# Patient Record
Sex: Female | Born: 1935 | Race: White | Hispanic: No | Smoking: Never smoker
Health system: Southern US, Community
[De-identification: ages and names within clinical notes are randomized; demographics above are authoritative.]

## PROBLEM LIST (undated history)

## (undated) DIAGNOSIS — F329 Major depressive disorder, single episode, unspecified: Secondary | ICD-10-CM

## (undated) DIAGNOSIS — R32 Unspecified urinary incontinence: Secondary | ICD-10-CM

## (undated) DIAGNOSIS — E079 Disorder of thyroid, unspecified: Secondary | ICD-10-CM

## (undated) DIAGNOSIS — Z9289 Personal history of other medical treatment: Secondary | ICD-10-CM

## (undated) DIAGNOSIS — I10 Essential (primary) hypertension: Secondary | ICD-10-CM

## (undated) DIAGNOSIS — E785 Hyperlipidemia, unspecified: Secondary | ICD-10-CM

## (undated) DIAGNOSIS — F419 Anxiety disorder, unspecified: Secondary | ICD-10-CM

## (undated) DIAGNOSIS — K219 Gastro-esophageal reflux disease without esophagitis: Secondary | ICD-10-CM

## (undated) DIAGNOSIS — M1990 Unspecified osteoarthritis, unspecified site: Secondary | ICD-10-CM

## (undated) DIAGNOSIS — D649 Anemia, unspecified: Secondary | ICD-10-CM

## (undated) DIAGNOSIS — H8109 Meniere's disease, unspecified ear: Secondary | ICD-10-CM

## (undated) DIAGNOSIS — F32A Depression, unspecified: Secondary | ICD-10-CM

## (undated) DIAGNOSIS — H9191 Unspecified hearing loss, right ear: Secondary | ICD-10-CM

## (undated) DIAGNOSIS — I38 Endocarditis, valve unspecified: Secondary | ICD-10-CM

## (undated) DIAGNOSIS — G43909 Migraine, unspecified, not intractable, without status migrainosus: Secondary | ICD-10-CM

## (undated) DIAGNOSIS — I219 Acute myocardial infarction, unspecified: Secondary | ICD-10-CM

## (undated) HISTORY — PX: JOINT REPLACEMENT: SHX530

## (undated) HISTORY — DX: Unspecified hearing loss, right ear: H91.91

## (undated) HISTORY — PX: ABDOMINAL HYSTERECTOMY: SHX81

## (undated) HISTORY — DX: Anxiety disorder, unspecified: F41.9

## (undated) HISTORY — PX: CAROTID ENDARTERECTOMY: SUR193

## (undated) HISTORY — DX: Major depressive disorder, single episode, unspecified: F32.9

## (undated) HISTORY — DX: Endocarditis, valve unspecified: I38

## (undated) HISTORY — DX: Hyperlipidemia, unspecified: E78.5

## (undated) HISTORY — DX: Unspecified urinary incontinence: R32

## (undated) HISTORY — DX: Depression, unspecified: F32.A

## (undated) HISTORY — DX: Meniere's disease, unspecified ear: H81.09

## (undated) HISTORY — DX: Unspecified osteoarthritis, unspecified site: M19.90

## (undated) HISTORY — DX: Disorder of thyroid, unspecified: E07.9

## (undated) HISTORY — PX: CORONARY ANGIOPLASTY WITH STENT PLACEMENT: SHX49

## (undated) HISTORY — DX: Gastro-esophageal reflux disease without esophagitis: K21.9

## (undated) HISTORY — DX: Essential (primary) hypertension: I10

## (undated) HISTORY — DX: Anemia, unspecified: D64.9

## (undated) HISTORY — DX: Migraine, unspecified, not intractable, without status migrainosus: G43.909

## (undated) HISTORY — PX: FOOT SURGERY: SHX648

## (undated) HISTORY — DX: Acute myocardial infarction, unspecified: I21.9

## (undated) HISTORY — DX: Personal history of other medical treatment: Z92.89

## (undated) HISTORY — PX: CORONARY ARTERY BYPASS GRAFT: SHX141

---

## 2004-01-24 ENCOUNTER — Other Ambulatory Visit: Payer: Self-pay

## 2005-09-27 ENCOUNTER — Other Ambulatory Visit: Payer: Self-pay

## 2006-02-27 ENCOUNTER — Other Ambulatory Visit: Payer: Self-pay

## 2008-10-28 ENCOUNTER — Ambulatory Visit: Payer: Self-pay | Admitting: Internal Medicine

## 2011-07-14 ENCOUNTER — Ambulatory Visit: Payer: Self-pay | Admitting: Internal Medicine

## 2011-08-03 ENCOUNTER — Ambulatory Visit: Payer: Self-pay | Admitting: Emergency Medicine

## 2011-08-09 ENCOUNTER — Ambulatory Visit: Payer: Self-pay | Admitting: Emergency Medicine

## 2011-08-16 ENCOUNTER — Ambulatory Visit: Payer: Self-pay | Admitting: Gynecologic Oncology

## 2011-08-22 ENCOUNTER — Ambulatory Visit: Payer: Self-pay | Admitting: Gynecologic Oncology

## 2011-09-19 ENCOUNTER — Ambulatory Visit: Payer: Self-pay | Admitting: Gynecologic Oncology

## 2011-09-25 ENCOUNTER — Inpatient Hospital Stay: Payer: Self-pay | Admitting: *Deleted

## 2011-10-20 ENCOUNTER — Ambulatory Visit: Payer: Self-pay | Admitting: Gynecologic Oncology

## 2011-11-06 ENCOUNTER — Ambulatory Visit: Payer: Self-pay | Admitting: Obstetrics and Gynecology

## 2011-11-28 ENCOUNTER — Inpatient Hospital Stay: Payer: Self-pay | Admitting: Obstetrics and Gynecology

## 2012-12-30 ENCOUNTER — Ambulatory Visit: Payer: Self-pay | Admitting: Surgery

## 2013-01-06 ENCOUNTER — Non-Acute Institutional Stay (SKILLED_NURSING_FACILITY): Payer: Medicare Other | Admitting: Internal Medicine

## 2013-01-06 DIAGNOSIS — I10 Essential (primary) hypertension: Secondary | ICD-10-CM

## 2013-01-06 DIAGNOSIS — S0101XD Laceration without foreign body of scalp, subsequent encounter: Secondary | ICD-10-CM

## 2013-01-06 DIAGNOSIS — E039 Hypothyroidism, unspecified: Secondary | ICD-10-CM

## 2013-01-06 DIAGNOSIS — F329 Major depressive disorder, single episode, unspecified: Secondary | ICD-10-CM

## 2013-01-06 DIAGNOSIS — R5383 Other fatigue: Secondary | ICD-10-CM

## 2013-01-06 DIAGNOSIS — I2510 Atherosclerotic heart disease of native coronary artery without angina pectoris: Secondary | ICD-10-CM

## 2013-01-06 DIAGNOSIS — R531 Weakness: Secondary | ICD-10-CM

## 2013-01-06 DIAGNOSIS — Z5189 Encounter for other specified aftercare: Secondary | ICD-10-CM

## 2013-01-06 NOTE — Progress Notes (Signed)
Patient ID: Kristen Barber, female   DOB: 1920-05-02, 79 y.o.   MRN: [id number]  CC- new admit post hospitalization 12/30/12- 01/02/13  Allergies  Allergen Reactions  . Celebrex (Celecoxib)    Code status- full code  HPI- 79 y/o very pleasant female patient is here for STR post fall with large left scalp laceration requiring stitches and repair under anaesthesia. Postop she had acute blood loss anemia and required 2 u prbc blood transfusion. She was evaluated by PT/OT and STR was recommended. Patient was seen in her room today. She is in no acute distress. Her pain is under control. She has worked with therapy this am and feels tired. Denies any complaint this visit. Appetite is good. See ros.  Review of Systems  Constitutional: Negative for fever, chills, weight loss, malaise/fatigue and diaphoresis.  HENT: Positive for hearing loss. Negative for ear pain and sore throat.   Respiratory: Negative for cough and shortness of breath.   Cardiovascular: Negative for chest pain, palpitations and leg swelling.  Gastrointestinal: Negative for heartburn, abdominal pain and constipation.  Genitourinary: Negative for dysuria.  Skin: Negative for rash.  Neurological: Positive for weakness. Negative for dizziness, focal weakness and headaches.  Psychiatric/Behavioral: Negative for depression.   PMH- hypothyroidism, HTN, depression, CAD  Social history- lives by herself, denies smoking and alcohol intake. Hard of hearing and uses hearing aid  Medications -  reviewed, see MAR  Procedures- CT head Ct cervical spine Evacuation of hematoma and laceration repair on 12/30/12  BP 136/73  Pulse 70  Temp(Src) 97.2 F (36.2 C)  Resp 16  SpO2 97%  Physical Exam  Constitutional:  Elderly, frail, in no acute distress  HENT:  Head: Normocephalic.  Has a sutured laceration on left scalp with stitches, site clean, no signs of infection  Eyes: Pupils are equal, round, and reactive to light.  Neck:  Normal range of motion. Neck supple.  Cardiovascular: Normal rate and regular rhythm.   Pulmonary/Chest: Effort normal and breath sounds normal.  Abdominal: Soft. Bowel sounds are normal.  Musculoskeletal: Normal range of motion. She exhibits no edema.  Lymphadenopathy:    She has no cervical adenopathy.  Neurological: She is alert.  Skin: Skin is warm and dry.  Resolving bruise present at nape of neck and arms  Psychiatric: Affect normal.   LABS- facility lab pending, no hospital labs available in hospital records  ASSESSMENT/PLAN-  generalized weakness- s/p fall, will need to work with PT/OT for gait training and muscle group strengthening. Once improved, she might need to be in assisted living for further safety. Needs fall precautions. Monitor po intake.   Scalp laceration- s/p hematoma evacuation and has sutures in place. Will need suture removal in a week and has appointment for this  Anemia- had acute blood loss anemia, s/p 2 u prbc transfusion. Continue ferrous sulfate supplement and check cbc in 1 week  HTN- bp under control. Continue current regimen of amlodipine 5 mg daily and isosorbide mononitrate 30 mg daily, monitor bp reading. Continue Kristen  Depression- continue venlafaxine and monitor mood, stable for now. On diazepam 2 mg bid prn for anxiety. Will need to taper patient off BZD if possible as it can increase fall risk. Her mood is stable this visit. Will decrease her diazepam to 1 mg bid prn for now and reassess.  CAD- remains chest pain free, continue carvedilol with isosorbide mononitrate and prn NTG with Kristen.  Hypothyroidism- continue levothyroxine 75 mcg daily, monitor, no changes  Labs ordered-  cbc, bmp

## 2013-01-07 ENCOUNTER — Other Ambulatory Visit: Payer: Self-pay | Admitting: *Deleted

## 2013-01-07 MED ORDER — DIAZEPAM 2 MG PO TABS: ORAL_TABLET | ORAL | Status: DC

## 2013-02-04 ENCOUNTER — Emergency Department: Payer: Self-pay | Admitting: Unknown Physician Specialty

## 2013-02-04 ENCOUNTER — Non-Acute Institutional Stay (SKILLED_NURSING_FACILITY): Payer: Medicare Other | Admitting: Nurse Practitioner

## 2013-02-04 DIAGNOSIS — Z9181 History of falling: Secondary | ICD-10-CM

## 2013-02-04 DIAGNOSIS — R2681 Unsteadiness on feet: Secondary | ICD-10-CM

## 2013-02-04 DIAGNOSIS — R531 Weakness: Secondary | ICD-10-CM

## 2013-02-04 DIAGNOSIS — R5381 Other malaise: Secondary | ICD-10-CM

## 2013-02-04 DIAGNOSIS — R269 Unspecified abnormalities of gait and mobility: Secondary | ICD-10-CM

## 2013-02-14 ENCOUNTER — Emergency Department: Payer: Self-pay | Admitting: Unknown Physician Specialty

## 2013-02-17 ENCOUNTER — Telehealth: Payer: Self-pay | Admitting: Family Medicine

## 2013-02-17 NOTE — Telephone Encounter (Signed)
Pt's son-in-law, Shelah Lewandowsky is your pt and he and pt's daughter would like to est Kristen Barber w/you as her PCP.  Your first new pt apptmt is not until October, however, due to pt's age they would like to be able to get her in w/you sooner.  Can you accommodate a new pt apptmt for her w/in the next couple of weeks? Thank you.

## 2013-02-17 NOTE — Telephone Encounter (Signed)
Please get a 30 min appointment and any recent labs. Thanks.

## 2013-02-19 ENCOUNTER — Encounter: Payer: Self-pay | Admitting: Nurse Practitioner

## 2013-02-19 NOTE — Progress Notes (Signed)
  Subjective:    Patient ID: Kristen Barber, female    DOB: [birth year], 79 y.o.   MRN: [id number]  HPI Comments: Pt was seen in room today for discharge from STR. She is here from [facility], s/p hematoma evacuation after a fall at home.  She has been stable here. Today she says she feels fine, is w/o any other complaint.      Review of Systems  All other systems reviewed and are negative.  The following portions of the patient's history were reviewed and updated as appropriate: allergies, current medications, past family history, past medical history, past social history, past surgical history and problem list.     Objective:   Physical Exam  Constitutional: She is oriented to person, place, and time. She appears well-developed. No distress.  Eyes: Pupils are equal, round, and reactive to light.  Cardiovascular: Normal rate and regular rhythm.   Pulmonary/Chest: Effort normal and breath sounds normal.  Neurological: She is alert and oriented to person, place, and time.  Skin: Skin is warm and dry. She is not diaphoretic.  Healed scalp sutures.  Psychiatric: She has a normal mood and affect.    Labs: 01/07/13 - wbc 4.7, hgb 9.5, hct 28.5, plt 207, na 140, k 3.6, glucose 84, bun 19, creatinine 1.03, calcium 8.3.      Assessment & Plan:  1) Discharge home with home health pt/ot. No DME. 2) She did not need any rx's. 3) pt is to follow up with pcp in 1-2 weeks.

## 2013-02-19 NOTE — Telephone Encounter (Signed)
Scheduled for Friday, February 21, 2013 @ 2:00 p.m.

## 2013-02-20 ENCOUNTER — Encounter: Payer: Self-pay | Admitting: Radiology

## 2013-02-21 ENCOUNTER — Ambulatory Visit (INDEPENDENT_AMBULATORY_CARE_PROVIDER_SITE_OTHER): Payer: Medicare Other | Admitting: Family Medicine

## 2013-02-21 ENCOUNTER — Encounter: Payer: Self-pay | Admitting: Family Medicine

## 2013-02-21 VITALS — BP 106/54 | HR 72 | Temp 98.0°F | Ht 60.0 in | Wt 117.5 lb

## 2013-02-21 DIAGNOSIS — K219 Gastro-esophageal reflux disease without esophagitis: Secondary | ICD-10-CM

## 2013-02-21 DIAGNOSIS — E039 Hypothyroidism, unspecified: Secondary | ICD-10-CM

## 2013-02-21 DIAGNOSIS — W19XXXS Unspecified fall, sequela: Secondary | ICD-10-CM

## 2013-02-21 DIAGNOSIS — H8109 Meniere's disease, unspecified ear: Secondary | ICD-10-CM

## 2013-02-21 DIAGNOSIS — R32 Unspecified urinary incontinence: Secondary | ICD-10-CM

## 2013-02-21 DIAGNOSIS — F341 Dysthymic disorder: Secondary | ICD-10-CM

## 2013-02-21 DIAGNOSIS — I10 Essential (primary) hypertension: Secondary | ICD-10-CM

## 2013-02-21 DIAGNOSIS — I2510 Atherosclerotic heart disease of native coronary artery without angina pectoris: Secondary | ICD-10-CM

## 2013-02-21 DIAGNOSIS — E785 Hyperlipidemia, unspecified: Secondary | ICD-10-CM

## 2013-02-21 DIAGNOSIS — F418 Other specified anxiety disorders: Secondary | ICD-10-CM

## 2013-02-21 NOTE — Patient Instructions (Signed)
Take care. We'll request your records.  Keep working on the balance exercises.

## 2013-02-24 ENCOUNTER — Encounter: Payer: Self-pay | Admitting: Family Medicine

## 2013-02-24 DIAGNOSIS — I2510 Atherosclerotic heart disease of native coronary artery without angina pectoris: Secondary | ICD-10-CM | POA: Insufficient documentation

## 2013-02-24 DIAGNOSIS — R32 Unspecified urinary incontinence: Secondary | ICD-10-CM | POA: Insufficient documentation

## 2013-02-24 DIAGNOSIS — K219 Gastro-esophageal reflux disease without esophagitis: Secondary | ICD-10-CM | POA: Insufficient documentation

## 2013-02-24 DIAGNOSIS — R296 Repeated falls: Secondary | ICD-10-CM | POA: Insufficient documentation

## 2013-02-24 DIAGNOSIS — E039 Hypothyroidism, unspecified: Secondary | ICD-10-CM | POA: Insufficient documentation

## 2013-02-24 DIAGNOSIS — H8109 Meniere's disease, unspecified ear: Secondary | ICD-10-CM | POA: Insufficient documentation

## 2013-02-24 DIAGNOSIS — I10 Essential (primary) hypertension: Secondary | ICD-10-CM | POA: Insufficient documentation

## 2013-02-24 DIAGNOSIS — E785 Hyperlipidemia, unspecified: Secondary | ICD-10-CM | POA: Insufficient documentation

## 2013-02-24 DIAGNOSIS — W19XXXA Unspecified fall, initial encounter: Secondary | ICD-10-CM | POA: Insufficient documentation

## 2013-02-24 DIAGNOSIS — F418 Other specified anxiety disorders: Secondary | ICD-10-CM | POA: Insufficient documentation

## 2013-02-24 NOTE — Progress Notes (Signed)
New patient.   Prev with a fall, scalp hematoma, to ER and needed transfusion.  Sent to SNF for inpt rehab. Then back to home, with a second fall.  Now back living with her daughter.  Has a lifeline. Her goal is to go back home.  Daughter's goal is safety.  Discussed both.    Hypertension and h/o CAD.   Using medication without problems or lightheadedness: yes Chest pain with exertion:no Edema:no Short of breath: chronic, since prev MI Requesting records from mult MDs, including cards.   H/o Meniere's and deaf in R ear.  D/w pt about fall precautions.  She has a h/o anxiety with rare BZD use.  D/w pt about limiting use.    PMH and SH reviewed  ROS: See HPI, otherwise noncontributory.  Meds, vitals, and allergies reviewed.   nad ncat except for L scalp with hair cut short from prev lac repair Mmm rrr ctab abd soft, not ttp Ext w/o edema

## 2013-02-24 NOTE — Assessment & Plan Note (Signed)
Requesting records.   

## 2013-02-24 NOTE — Assessment & Plan Note (Signed)
Requesting records.  Not orthostatic.  No CP.

## 2013-02-24 NOTE — Assessment & Plan Note (Signed)
Requesting records.  D/w pt about limiting BZD use.

## 2013-02-24 NOTE — Assessment & Plan Note (Signed)
Acceptable, no change in meds.

## 2013-02-24 NOTE — Assessment & Plan Note (Signed)
Requesting records.  she'll continue work on balance exercises at home.  She has family at home now and an alarm bracelet.

## 2013-03-12 ENCOUNTER — Other Ambulatory Visit: Payer: Self-pay | Admitting: Family Medicine

## 2013-03-12 ENCOUNTER — Encounter: Payer: Self-pay | Admitting: Family Medicine

## 2013-03-12 DIAGNOSIS — I10 Essential (primary) hypertension: Secondary | ICD-10-CM

## 2013-03-12 DIAGNOSIS — I38 Endocarditis, valve unspecified: Secondary | ICD-10-CM | POA: Insufficient documentation

## 2013-03-12 DIAGNOSIS — Z862 Personal history of diseases of the blood and blood-forming organs and certain disorders involving the immune mechanism: Secondary | ICD-10-CM

## 2013-03-14 ENCOUNTER — Encounter: Payer: Self-pay | Admitting: Family Medicine

## 2013-03-14 LAB — GLUCOSE (CC13)
Hemoglobin Other: 12.8
TSH: 4.53

## 2013-03-16 ENCOUNTER — Telehealth: Payer: Self-pay | Admitting: Family Medicine

## 2013-03-16 NOTE — Telephone Encounter (Signed)
Message copied by Joaquim Nam on Sun Mar 16, 2013  1:37 PM ------      Message from: Annamarie Major      Created: Fri Mar 14, 2013  2:46 PM       Daughter advised.      ----- Message -----         From: Joaquim Nam, MD         Sent: 03/12/2013  11:35 PM           To: Annamarie Major, CMA            Call pt.  Records reviewed.  Will be due for fastings labs when possible.  Thanks.         ------

## 2013-03-18 ENCOUNTER — Encounter: Payer: Self-pay | Admitting: Internal Medicine

## 2013-04-10 ENCOUNTER — Other Ambulatory Visit (INDEPENDENT_AMBULATORY_CARE_PROVIDER_SITE_OTHER): Payer: Medicare Other

## 2013-04-10 DIAGNOSIS — Z862 Personal history of diseases of the blood and blood-forming organs and certain disorders involving the immune mechanism: Secondary | ICD-10-CM

## 2013-04-10 DIAGNOSIS — I10 Essential (primary) hypertension: Secondary | ICD-10-CM

## 2013-04-10 LAB — LIPID PANEL
Cholesterol: 183 mg/dL (ref 0–200)
LDL Cholesterol: 103 mg/dL — ABNORMAL HIGH (ref 0–99)
Total CHOL/HDL Ratio: 3

## 2013-04-10 LAB — CBC WITH DIFFERENTIAL/PLATELET
Basophils Relative: 0.5 % (ref 0.0–3.0)
HCT: 34.2 % — ABNORMAL LOW (ref 36.0–46.0)
Hemoglobin: 11.4 g/dL — ABNORMAL LOW (ref 12.0–15.0)
Lymphocytes Relative: 26.6 % (ref 12.0–46.0)
Lymphs Abs: 1.2 10*3/uL (ref 0.7–4.0)
Monocytes Relative: 8 % (ref 3.0–12.0)
Neutro Abs: 2.9 10*3/uL (ref 1.4–7.7)
RBC: 3.43 Mil/uL — ABNORMAL LOW (ref 3.87–5.11)

## 2013-04-10 LAB — COMPREHENSIVE METABOLIC PANEL
ALT: 12 U/L (ref 0–35)
AST: 18 U/L (ref 0–37)
CO2: 26 mEq/L (ref 19–32)
Calcium: 9.7 mg/dL (ref 8.4–10.5)
Chloride: 108 mEq/L (ref 96–112)
GFR: 46.05 mL/min — ABNORMAL LOW (ref >60.00)
Potassium: 3.8 mEq/L (ref 3.5–5.1)
Sodium: 140 mEq/L (ref 135–145)
Total Protein: 6.6 g/dL (ref 6.0–8.3)

## 2013-04-11 ENCOUNTER — Encounter: Payer: Self-pay | Admitting: *Deleted

## 2013-04-30 ENCOUNTER — Telehealth: Payer: Self-pay

## 2013-04-30 NOTE — Telephone Encounter (Signed)
Dr Maree Krabbe office in [place] called; pt moving back home and needs refill on Enablex and Dr Arlana Pouch request our records; Dr Maree Krabbe office will fax record release and will ck with [facility] about pt refill.

## 2013-05-01 NOTE — Telephone Encounter (Signed)
[name] pts son called to ck on Enablex refill; spoke with Rosanne Ashing at [place] and rx ready for pick up, refilled by Dr Arlana Pouch. [name] appreciated info; Gala Romney said pt and pts daughter have different opinions of who should be pts PCP; doug said they would discuss and make final decision.

## 2013-07-04 ENCOUNTER — Other Ambulatory Visit: Payer: Self-pay | Admitting: *Deleted

## 2013-07-04 MED ORDER — DARIFENACIN HYDROBROMIDE ER 7.5 MG PO TB24: 7.5000 mg | ORAL_TABLET | Freq: Every day | ORAL | Status: DC

## 2013-07-11 ENCOUNTER — Other Ambulatory Visit: Payer: Self-pay | Admitting: *Deleted

## 2013-07-11 NOTE — Telephone Encounter (Signed)
Faxed refill request. Please advise.  

## 2013-07-12 NOTE — Telephone Encounter (Signed)
See prev phone note.  It isn't clear that patient is going to stay with [facility] for care.  If not, then the new MD should address this refill. Let me know if they are going to stay with [place].

## 2013-07-14 MED ORDER — VENLAFAXINE HCL 37.5 MG PO TABS: 37.5000 mg | ORAL_TABLET | Freq: Every day | ORAL | Status: DC

## 2013-07-14 NOTE — Telephone Encounter (Signed)
Spoke to patient's daughter and was advised that she has decided to continue her care with Dr. Para March.

## 2013-07-16 ENCOUNTER — Other Ambulatory Visit: Payer: Self-pay | Admitting: *Deleted

## 2013-07-16 NOTE — Telephone Encounter (Signed)
Received a refill request also for Diazepam 2 mg, take one twice daily as needed. This does not match medication list. Please advise. Last office visit 02/21/13.

## 2013-07-17 ENCOUNTER — Other Ambulatory Visit: Payer: Self-pay | Admitting: *Deleted

## 2013-07-17 MED ORDER — DIAZEPAM 2 MG PO TABS: ORAL_TABLET | ORAL | Status: DC

## 2013-07-17 MED ORDER — LEVOTHYROXINE SODIUM 75 MCG PO TABS: 75.0000 ug | ORAL_TABLET | Freq: Every day | ORAL | Status: DC

## 2013-07-17 NOTE — Telephone Encounter (Signed)
levothyroid send, please call in diazepam as listed.  She will be due for a BP check this fall.  Thanks.

## 2013-07-17 NOTE — Telephone Encounter (Signed)
The request was for Diazepam 2 mg twice daily as needed.  This is different than what you okayed.  Is this your intention? (See original request).

## 2013-07-17 NOTE — Telephone Encounter (Signed)
I would go with the lower dose.  I believe that was how we talked about her taking it at the OV.

## 2013-07-17 NOTE — Telephone Encounter (Signed)
Medication phoned to pharmacy.  

## 2013-08-06 ENCOUNTER — Ambulatory Visit (INDEPENDENT_AMBULATORY_CARE_PROVIDER_SITE_OTHER): Payer: Medicare Other | Admitting: Family Medicine

## 2013-08-06 ENCOUNTER — Encounter: Payer: Self-pay | Admitting: Family Medicine

## 2013-08-06 VITALS — BP 158/78 | HR 72 | Temp 98.1°F | Ht 60.0 in | Wt 121.2 lb

## 2013-08-06 DIAGNOSIS — Z9181 History of falling: Secondary | ICD-10-CM

## 2013-08-06 DIAGNOSIS — W19XXXA Unspecified fall, initial encounter: Secondary | ICD-10-CM

## 2013-08-06 DIAGNOSIS — W19XXXD Unspecified fall, subsequent encounter: Secondary | ICD-10-CM

## 2013-08-06 MED ORDER — DIAZEPAM 2 MG PO TABS: ORAL_TABLET | ORAL | Status: DC

## 2013-08-06 NOTE — Patient Instructions (Addendum)
I would get an eye appointment.   Try to cut the diazepam down to 1mg  a day.   Shirlee Limerick will call about your referral. Take care.

## 2013-08-06 NOTE — Progress Notes (Signed)
Pre-visit discussion using our clinic review tool. No additional management support is needed unless otherwise documented below in the visit note.  She has fallen recently, mult times.  She has also had some "near misses" with some slips.  Falls started happening after age 79.  She was prev in PT and had been at SNF earlier this year.  She wants to stay at home. Has help about 20 hours per week at home.  Wears an alarm at home. Uses a cane usually.  Prev notes reviewed.   Not dizzy or lightheaded. Vision may play a role along with posture, eye clinic eval is pending.   Meds, vitals, and allergies reviewed.   ROS: See HPI.  Otherwise, noncontributory.  nad ncat Mmm rrr ctab abd soft, not ttp Ext w/o edema Superficial scrapes notes on L knee Gait balanced but slow.  Using cane in L hand. No foot drop. Doesn't list from side to side.

## 2013-08-07 NOTE — Assessment & Plan Note (Signed)
Would ask for Kristen Barber safety eval and PT for fall risk.  She may be able to continue at home with monitoring and support.  If she fails the safety eval, then we'll have to consider other options.  She absolutely doesn't want to go anywhere else for now, so this is a reasonable compromise for now.  She'll get an eye exam and we'll decrease her valium, possibly stop this.  She agrees. >25 min spent with face to face with patient, >50% counseling and/or coordinating care.

## 2013-08-19 ENCOUNTER — Telehealth: Payer: Self-pay

## 2013-08-19 NOTE — Telephone Encounter (Signed)
Cesar PT with [facility] left v/m requesting verbal order for PT home health 2 x a week for 6 weeks for gait training and balance.

## 2013-08-19 NOTE — Telephone Encounter (Signed)
Cesar PT with [facility] advised.

## 2013-08-19 NOTE — Telephone Encounter (Signed)
Please give the order.  Thanks.   

## 2013-08-24 DIAGNOSIS — H8109 Meniere's disease, unspecified ear: Secondary | ICD-10-CM

## 2013-08-24 DIAGNOSIS — I10 Essential (primary) hypertension: Secondary | ICD-10-CM

## 2013-08-24 DIAGNOSIS — Z9181 History of falling: Secondary | ICD-10-CM

## 2013-08-24 DIAGNOSIS — R269 Unspecified abnormalities of gait and mobility: Secondary | ICD-10-CM

## 2013-08-24 DIAGNOSIS — I2510 Atherosclerotic heart disease of native coronary artery without angina pectoris: Secondary | ICD-10-CM

## 2013-10-23 ENCOUNTER — Other Ambulatory Visit: Payer: Self-pay

## 2013-10-23 MED ORDER — DARIFENACIN HYDROBROMIDE ER 7.5 MG PO TB24: 7.5000 mg | ORAL_TABLET | Freq: Every day | ORAL | Status: DC

## 2013-10-23 NOTE — Telephone Encounter (Signed)
Sent. Thanks.   

## 2013-10-23 NOTE — Telephone Encounter (Signed)
Pt request refill for enablex to [facility]. Please advise. Pt said only got 1/2 rx last time. Not sure why but pt request new refills for 90 days.

## 2014-01-29 ENCOUNTER — Telehealth: Payer: Self-pay | Admitting: *Deleted

## 2014-01-29 NOTE — Telephone Encounter (Signed)
Fax refill request, last OV 08/06/13, please advise

## 2014-01-30 ENCOUNTER — Other Ambulatory Visit: Payer: Self-pay | Admitting: Family Medicine

## 2014-01-30 MED ORDER — VENLAFAXINE HCL ER 37.5 MG PO CP24: 37.5000 mg | ORAL_CAPSULE | Freq: Every day | ORAL | Status: AC

## 2014-01-30 MED ORDER — VENLAFAXINE HCL 37.5 MG PO TABS: 37.5000 mg | ORAL_TABLET | Freq: Every day | ORAL | Status: DC

## 2014-01-30 NOTE — Telephone Encounter (Signed)
Spoke with daughter on cell phone.   Patient did have a fall about 3 weeks ago in a restaurant but daughter says she didn't appear to be hurt, just "shook up" a little.  She will call for appt later, she is in [place] right now.

## 2014-01-30 NOTE — Telephone Encounter (Signed)
Left message on patient's voicemail to return call

## 2014-01-30 NOTE — Telephone Encounter (Signed)
I called over to pharmacy.  She had been on XR venlafaxine 37.5mg  a day for over a year.  We had the non-XR formulation listed.  She should continue with what she has and they'll change the ordered rx to XR. I updated her list her.  This represents no change in her meds.

## 2014-01-30 NOTE — Telephone Encounter (Signed)
[facility] called to say they sent request for Venlafaxine XR but RX sent in was for regular strength.

## 2014-01-30 NOTE — Telephone Encounter (Signed)
Noted, thanks!

## 2014-01-30 NOTE — Telephone Encounter (Signed)
Sent, thanks.  Please get update on patient. H/o falls. Let me know how she is doing.  If any recent falls, then please set f/u appointment. Thanks.

## 2014-03-30 ENCOUNTER — Ambulatory Visit (INDEPENDENT_AMBULATORY_CARE_PROVIDER_SITE_OTHER): Payer: Medicare Other | Admitting: Family Medicine

## 2014-03-30 ENCOUNTER — Telehealth: Payer: Self-pay | Admitting: Family Medicine

## 2014-03-30 ENCOUNTER — Encounter: Payer: Self-pay | Admitting: Family Medicine

## 2014-03-30 VITALS — BP 154/80 | HR 92 | Temp 98.1°F | Wt 114.5 lb

## 2014-03-30 DIAGNOSIS — I10 Essential (primary) hypertension: Secondary | ICD-10-CM

## 2014-03-30 DIAGNOSIS — W19XXXS Unspecified fall, sequela: Secondary | ICD-10-CM

## 2014-03-30 DIAGNOSIS — T7589XS Other specified effects of external causes, sequela: Secondary | ICD-10-CM

## 2014-03-30 DIAGNOSIS — E039 Hypothyroidism, unspecified: Secondary | ICD-10-CM

## 2014-03-30 DIAGNOSIS — E785 Hyperlipidemia, unspecified: Secondary | ICD-10-CM

## 2014-03-30 NOTE — Assessment & Plan Note (Signed)
Continue meds, see notes on labs.  

## 2014-03-30 NOTE — Telephone Encounter (Signed)
Relevant patient education mailed to patient.  

## 2014-03-30 NOTE — Assessment & Plan Note (Signed)
[facility] and she continues to use her cane. Has an alarm to wear, has in home help.  She wants to stay at home.  Discussed.   Isolation potential, ie homebound, d/w pt.  She is aware. If her mood continues to be low after starting PT, then she'll notify me.  She and her son agree.

## 2014-03-30 NOTE — Patient Instructions (Signed)
Kristen Barber will call about your referral. Go to the lab on the way out.  We'll contact you with your lab report. Take care.

## 2014-03-30 NOTE — Progress Notes (Signed)
Pre visit review using our clinic review tool, if applicable. No additional management support is needed unless otherwise documented below in the visit note.  Two recent falls. One fall was forward, coming out of a store with friends.  The other episode was at home.  She has fallen recently, mult times. She has also had some "near misses" with some slips. Falls started happening after age 80. She was prev in PT and had been at SNF earlier this year. She wants to stay at home. Has help about 20 hours per week at home. Wears an alarm at home. Uses a cane usually. Prev notes reviewed.   The room doesn't spin.  She doesn't feel lightheaded but "I feel like I'm going down."  "I can't keep my balance."  She has been doing some exercises at home, not formal PT.    HTN. Compliant with med.  No ADE on meds.  No orthostatic sx.   Hypothyroidism.  Compliant with meds.  Due for labs.    Meds, vitals, and allergies reviewed.   ROS: See HPI.  Otherwise, noncontributory.  nad ncat Mmm Neck supple, no LA, no TMG rrr ctab Abd soft, normal BS Ext w/o edema Kyphosis noted.  S/s wnl for the ext x4 but walks leaning forward, using a cane.  Has to push off on chair to stand. Slightly unsteady gait, but symmetric steps

## 2014-03-31 LAB — TSH: TSH: 1.42 u[IU]/mL (ref 0.35–4.50)

## 2014-03-31 LAB — BASIC METABOLIC PANEL
BUN: 27 mg/dL — AB (ref 6–23)
CO2: 23 mEq/L (ref 19–32)
Calcium: 9.8 mg/dL (ref 8.4–10.5)
Chloride: 107 mEq/L (ref 96–112)
Creatinine, Ser: 1.4 mg/dL — ABNORMAL HIGH (ref 0.4–1.2)
GFR: 38.3 mL/min — ABNORMAL LOW (ref >60.00)
Glucose, Bld: 135 mg/dL — ABNORMAL HIGH (ref 70–99)
Potassium: 3.7 mEq/L (ref 3.5–5.1)
Sodium: 141 mEq/L (ref 135–145)

## 2014-04-17 ENCOUNTER — Other Ambulatory Visit: Payer: Self-pay | Admitting: *Deleted

## 2014-04-17 NOTE — Telephone Encounter (Signed)
Faxed refill request. Last Filled:   90 tablet 1 RF on   10/23/2013.  Not sure if I could RF this.  Please advise.

## 2014-04-18 MED ORDER — DARIFENACIN HYDROBROMIDE ER 7.5 MG PO TB24: 7.5000 mg | ORAL_TABLET | Freq: Every day | ORAL | Status: AC

## 2014-04-18 NOTE — Telephone Encounter (Signed)
Sent. Thanks.   

## 2014-04-23 DIAGNOSIS — R269 Unspecified abnormalities of gait and mobility: Secondary | ICD-10-CM

## 2014-04-23 DIAGNOSIS — M1990 Unspecified osteoarthritis, unspecified site: Secondary | ICD-10-CM

## 2014-04-23 DIAGNOSIS — IMO0001 Reserved for inherently not codable concepts without codable children: Secondary | ICD-10-CM

## 2014-04-23 DIAGNOSIS — M6281 Muscle weakness (generalized): Secondary | ICD-10-CM

## 2014-05-20 ENCOUNTER — Other Ambulatory Visit: Payer: Self-pay | Admitting: *Deleted

## 2014-05-20 MED ORDER — LEVOTHYROXINE SODIUM 75 MCG PO TABS: 75.0000 ug | ORAL_TABLET | Freq: Every day | ORAL | Status: AC

## 2014-06-25 ENCOUNTER — Encounter: Payer: Self-pay | Admitting: Family Medicine

## 2014-06-25 ENCOUNTER — Ambulatory Visit (INDEPENDENT_AMBULATORY_CARE_PROVIDER_SITE_OTHER): Payer: Medicare Other | Admitting: Family Medicine

## 2014-06-25 VITALS — BP 122/78 | HR 84 | Temp 98.2°F | Resp 16 | Wt 115.5 lb

## 2014-06-25 DIAGNOSIS — F05 Delirium due to known physiological condition: Secondary | ICD-10-CM

## 2014-06-25 DIAGNOSIS — R410 Disorientation, unspecified: Secondary | ICD-10-CM

## 2014-06-25 LAB — POCT URINALYSIS DIPSTICK
Glucose, UA: NEGATIVE
KETONES UA: NEGATIVE
Nitrite, UA: NEGATIVE
PH UA: 5.5
PROTEIN UA: 30
RBC UA: NEGATIVE
Spec Grav, UA: 1.025
Urobilinogen, UA: 0.2

## 2014-06-25 LAB — COMPREHENSIVE METABOLIC PANEL
ALT: 12 U/L (ref 0–35)
AST: 20 U/L (ref 0–37)
Albumin: 3.5 g/dL (ref 3.5–5.2)
Alkaline Phosphatase: 60 U/L (ref 39–117)
BILIRUBIN TOTAL: 0.6 mg/dL (ref 0.2–1.2)
BUN: 21 mg/dL (ref 6–23)
CO2: 26 mEq/L (ref 19–32)
Calcium: 9.6 mg/dL (ref 8.4–10.5)
Chloride: 104 mEq/L (ref 96–112)
Creatinine, Ser: 1.3 mg/dL — ABNORMAL HIGH (ref 0.4–1.2)
GFR: 41.78 mL/min — ABNORMAL LOW (ref >60.00)
Glucose, Bld: 102 mg/dL — ABNORMAL HIGH (ref 70–99)
Potassium: 3.4 mEq/L — ABNORMAL LOW (ref 3.5–5.1)
Sodium: 139 mEq/L (ref 135–145)
TOTAL PROTEIN: 7.2 g/dL (ref 6.0–8.3)

## 2014-06-25 LAB — CBC WITH DIFFERENTIAL/PLATELET
Basophils Absolute: 0 10*3/uL (ref 0.0–0.1)
Basophils Relative: 0.3 % (ref 0.0–3.0)
Eosinophils Absolute: 0.1 10*3/uL (ref 0.0–0.7)
Eosinophils Relative: 1.4 % (ref 0.0–5.0)
HCT: 35.9 % — ABNORMAL LOW (ref 36.0–46.0)
Hemoglobin: 11.7 g/dL — ABNORMAL LOW (ref 12.0–15.0)
Lymphocytes Relative: 14.6 % (ref 12.0–46.0)
Lymphs Abs: 0.8 10*3/uL (ref 0.7–4.0)
MCHC: 32.6 g/dL (ref 30.0–36.0)
MCV: 102 fl — ABNORMAL HIGH (ref 78.0–100.0)
MONO ABS: 0.4 10*3/uL (ref 0.1–1.0)
Monocytes Relative: 6.6 % (ref 3.0–12.0)
Neutro Abs: 4.4 10*3/uL (ref 1.4–7.7)
Neutrophils Relative %: 77.1 % — ABNORMAL HIGH (ref 43.0–77.0)
PLATELETS: 230 10*3/uL (ref 150.0–400.0)
RBC: 3.52 Mil/uL — ABNORMAL LOW (ref 3.87–5.11)
RDW: 13.6 % (ref 11.5–15.5)
WBC: 5.7 10*3/uL (ref 4.0–10.5)

## 2014-06-25 MED ORDER — CIPROFLOXACIN HCL 250 MG PO TABS: 250.0000 mg | ORAL_TABLET | Freq: Two times a day (BID) | ORAL | Status: DC

## 2014-06-25 NOTE — Assessment & Plan Note (Signed)
Nontoxic, alert, normal conversation now.  She may have a UTI.  Check ucx, start cipro in the meantime, check CBC and CMET. Supervision at home for now, they'll update me and we'll notify them re: labs.  Okay for outpatient f/u.  We don't need to CT her head at this point, with no recent falls in the last week.   The prev dream changes over the summer may be incidental, d/w pt.  She wasn't having waking sx at that point, except for the moments right after waking form a dream.  >25 minutes spent in face to face time with patient, >50% spent in counselling or coordination of care.

## 2014-06-25 NOTE — Patient Instructions (Signed)
Go to the lab on the way out.  We'll contact you with your lab report. Supervision at home for the near future.  Update me in a few days.  Take care.  Glad to see you.

## 2014-06-25 NOTE — Progress Notes (Signed)
Pre visit review using our clinic review tool, if applicable. No additional management support is needed unless otherwise documented below in the visit note.  No recent med changes, started or stopped.  On baseline meds.  Patient and family noted changes over the last few months.  Patient had been having abnormal dreams during the summer, waking up looking for dead relatives.  She still lives alone at baseline.   Yesterday she didn't answer the phone, so family went to check on her.  She was confused, thought that she was surrounded by people (who weren't there).  She was still at home, but didn't fully recognize that she was still at home.  She was scared by the episode.  She had been recently wondering when her (dead) husband was coming home.    She is hard of hearing at baseline.    Family was concerned about a UTI.  She has urinary frequency, has it at baseline, worse recently.  Wears a pad.  No known fevers, but felt hot last night.  Dec in appetite recently.  No vomiting, no diarrhea.  She feels diffusely weak.  No witnessed falls, but fell a few weeks ago per patient report (no one know about this until today, no LOC).  She has had some HA recently.    Meds, vitals, and allergies reviewed.   ROS: See HPI.  Otherwise, noncontributory.  nad ncat A&Ox3, 3/3 on recall.  Can follow commands and do basic math. HOH at baseline.   Mmm Neck supple, no LA rrr ctab Abd soft, not ttp Ext w/o edema CN 2-12 wnl B, S/S/DTR wnl x4

## 2014-06-26 ENCOUNTER — Encounter: Payer: Self-pay | Admitting: *Deleted

## 2014-06-27 LAB — URINE CULTURE: Colony Count: >=100000

## 2014-06-29 ENCOUNTER — Telehealth: Payer: Self-pay | Admitting: Family Medicine

## 2014-06-29 DIAGNOSIS — R519 Headache, unspecified: Secondary | ICD-10-CM

## 2014-06-29 DIAGNOSIS — R51 Headache: Principal | ICD-10-CM

## 2014-06-29 NOTE — Telephone Encounter (Signed)
Pt's daughter calling to get lab results from last week and to let you know the pt is still somewhat confused and still has accompanying headaches. Pts daughter inquiring about a neurological referral. Please advise! Thank you

## 2014-06-29 NOTE — Telephone Encounter (Signed)
We can do that if needed, but we should probably get a CT scan of her head done first.  Let me know and I'll order it.  See notes on recent labs.  Thanks.

## 2014-06-30 NOTE — Telephone Encounter (Signed)
Patient's daughter notified as instructed by telephone. Lab results given. Was advised by [name] that she would like to go ahead and get a CT scan of head because of headaches and her stuttering at times. Donna Christendvised Janet that after the order has been put in she will get a call from the referral coordinator to get this set up.

## 2014-06-30 NOTE — Telephone Encounter (Signed)
Ordered. Thanks

## 2014-07-06 ENCOUNTER — Encounter: Payer: Self-pay | Admitting: Family Medicine

## 2014-07-08 ENCOUNTER — Encounter: Payer: Self-pay | Admitting: Family Medicine

## 2014-07-08 ENCOUNTER — Other Ambulatory Visit: Payer: Self-pay | Admitting: Family Medicine

## 2014-07-08 DIAGNOSIS — I639 Cerebral infarction, unspecified: Secondary | ICD-10-CM | POA: Insufficient documentation

## 2014-07-08 MED ORDER — ASPIRIN EC 81 MG PO TBEC: 81.0000 mg | DELAYED_RELEASE_TABLET | Freq: Every day | ORAL | Status: AC

## 2014-07-13 ENCOUNTER — Telehealth: Payer: Self-pay

## 2014-07-13 NOTE — Telephone Encounter (Signed)
[name] (daughter) notified as instructed by telephone. [name] stated that this has been an ongoing problem, no CP or SOB. [name] stated that she is in [place] now and is going to check on patient and will call back later to schedule an appointment if she feels that it is necessary.

## 2014-07-13 NOTE — Telephone Encounter (Signed)
Needs repeat eval, if CP/SOB to ER.

## 2014-07-13 NOTE — Telephone Encounter (Signed)
Kristen Barber pts daughter left v/m; pts daughter is concerned; pt is not aware of her surroundings; Kristen Barber wants to know what should do next. Kristen Barber request cb [phone].

## 2014-07-13 NOTE — Telephone Encounter (Signed)
Noted, thanks. Please get an update on patient tomorrow, if they don't call back.

## 2014-07-14 NOTE — Telephone Encounter (Signed)
Appointment made for 30 minutes.

## 2014-07-14 NOTE — Telephone Encounter (Signed)
Spoke to [place] and was advised that there has not been much change and patient requested to come back in for an evaluation. Appointment scheduled 07/17/14.

## 2014-07-14 NOTE — Telephone Encounter (Signed)
Noted, thanks!

## 2014-07-17 ENCOUNTER — Ambulatory Visit (INDEPENDENT_AMBULATORY_CARE_PROVIDER_SITE_OTHER): Payer: Medicare Other | Admitting: Family Medicine

## 2014-07-17 ENCOUNTER — Encounter: Payer: Self-pay | Admitting: Family Medicine

## 2014-07-17 VITALS — BP 162/92 | HR 88 | Temp 97.9°F | Wt 115.5 lb

## 2014-07-17 DIAGNOSIS — R410 Disorientation, unspecified: Secondary | ICD-10-CM

## 2014-07-17 DIAGNOSIS — F05 Delirium due to known physiological condition: Secondary | ICD-10-CM

## 2014-07-17 NOTE — Patient Instructions (Signed)
I would talk with your family about your goals, needs and desires. The three question I usually ask are "What do you have, what do you want, and what do you need." I would look at what you can currently do independently, with some help, and with full assistance. That may direct your plans for your living arrangement and level of help.  I wouldn't change your meds at this point. We can always get you to see neurology if needed.   Let me know what decisions you make or what questions you have.  Take care.

## 2014-07-17 NOTE — Progress Notes (Signed)
Pre visit review using our clinic review tool, if applicable. No additional management support is needed unless otherwise documented below in the visit note.  Prev with subacute delirium, ucx positive, treated, and she was improved.  CT with old CVA noted, no prior known CVA per patient.  Still on ASA now.    She feels weak diffusely.  SOB with exertion, had gradually gotten worse but was preexisting.   She thinks he memory had declined.  Pending daylight savings time has been a source of confusion for her.  She previously had odd and upsetting dreams, but those have subsided.   She is not dizzy but she is worried about her situation.  She is worried about falling.  She got "rattled" and stammered about her situation, trying to explain herself, during the office visit. She has more home care now, has slept better- more restfully- when others are present.   She would like to stay at home as long as possible, she doesn't know what she wants but she wanted to stay at home if possible.    Meds, vitals, and allergies reviewed.   ROS: See HPI.  Otherwise, noncontributory.  GEN: nad, alert and oriented, elderly female HEENT: mucous membranes moist NECK: supple w/o LA CV: rrr. PULM: ctab, no inc wob ABD: soft, +bs EXT: no edema SKIN: no acute rash  Oriented to date- 2015, October, Friday.   3/3 registration, 2/3 recall Can do math, can read a clock face.

## 2014-07-19 NOTE — Assessment & Plan Note (Signed)
With prev ucx treated and improved at that point. Goals of care discussed with patient today.  She has no emergent sx, will have her consider her situation with her family and then get back to me.  See AVS. >25 minutes spent in face to face time with patient, >50% spent in counselling or coordination of care.

## 2014-07-31 ENCOUNTER — Telehealth: Payer: Self-pay

## 2014-07-31 DIAGNOSIS — F05 Delirium due to known physiological condition: Principal | ICD-10-CM

## 2014-07-31 DIAGNOSIS — R410 Disorientation, unspecified: Secondary | ICD-10-CM

## 2014-07-31 NOTE — Telephone Encounter (Signed)
Kristen Barber pts daughter left v/m; last night pt had periods of mild confusion; pt thought her parents were coming to pick her up. Today pt seem fine but Kristen Barber wants to know if should do neurological referral. Kristen Barber request cb.

## 2014-07-31 NOTE — Telephone Encounter (Signed)
We can get it ordered, if patient/family desires.  Let me know and I'll put it in. Thanks.

## 2014-08-03 NOTE — Telephone Encounter (Signed)
[name] returned your call.  Please call her back at [phone].

## 2014-08-03 NOTE — Telephone Encounter (Signed)
[name] notified as instructed by telephone. [name] stated that the patient has gotten really confused in the last several months.  Patient fell about 2 years ago and hit her head and had to have surgery. [name] stated that she felt like the neuro referral would be the next best step to take. Donna Christendvised Janet that the referral coordinator will be in touch with her to get this scheduled. [name] stated to call her on her cell phone [phone].

## 2014-08-03 NOTE — Telephone Encounter (Signed)
Left message at home number for [name] to call back.

## 2014-08-04 NOTE — Telephone Encounter (Signed)
Ordered. Thanks

## 2014-08-30 ENCOUNTER — Other Ambulatory Visit: Payer: Self-pay | Admitting: Emergency Medicine

## 2014-09-07 ENCOUNTER — Telehealth: Payer: Self-pay | Admitting: Family Medicine

## 2014-09-07 NOTE — Telephone Encounter (Signed)
Called and LMOVM for patient's son, offering condolences.  Couldn't LMOVM for patient's daughter.

## 2014-09-18 DEATH — deceased

## 2016-01-02 IMAGING — CT CT ANGIO AORTIC DISSECTION W/ CM
2 of 6 series · 15 of 46 positions shown, 19 images · IV contrast (APPLIED)
Comparison: None.

CLINICAL DATA: Acute chest pain.

EXAM:
CT ANGIOGRAPHY CHEST WITH CONTRAST
TECHNIQUE: Multidetector CT imaging of the chest was performed using the
standard protocol during bolus administration of intravenous
contrast. Multiplanar CT image reconstructions and MIPs were
obtained to evaluate the vascular anatomy.
CONTRAST:  50 mL of Omnipaque 350 intravenously. Reduced dose was
given due to renal insufficiency.

[Series 6: cta chest · axial · 0.56mm/px · z∈[-256,-12]mm · 12 of 142 slices shown, 16 images]
[im 13/142  soft-tissue]
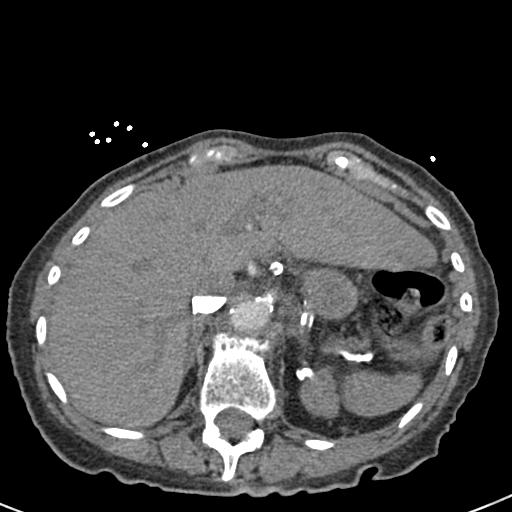
[im 13/142  bone]
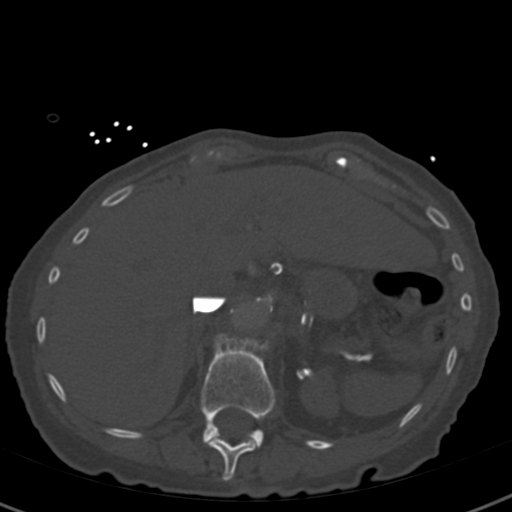
[im 26/142  soft-tissue]
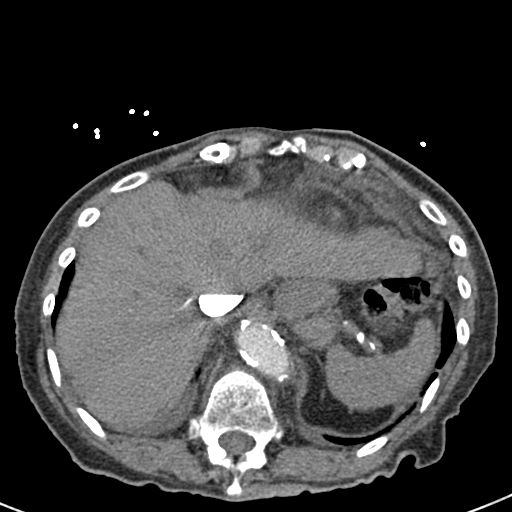
[im 39/142  soft-tissue]
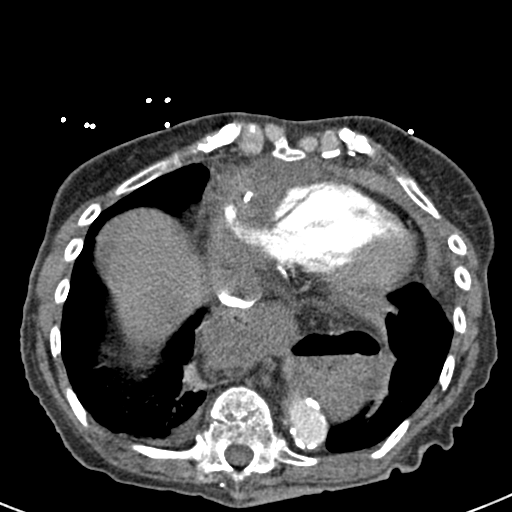
[im 52/142  soft-tissue]
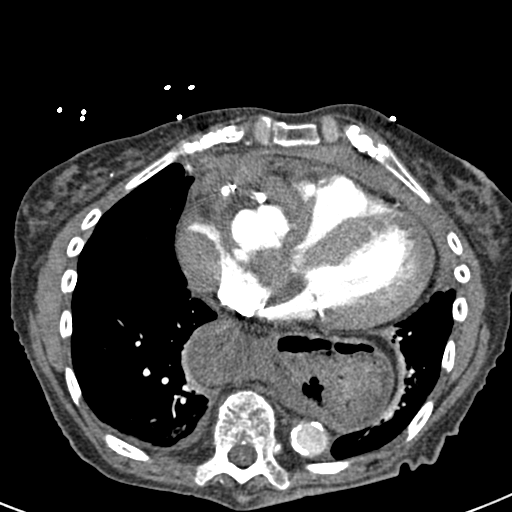
[im 65/142  soft-tissue]
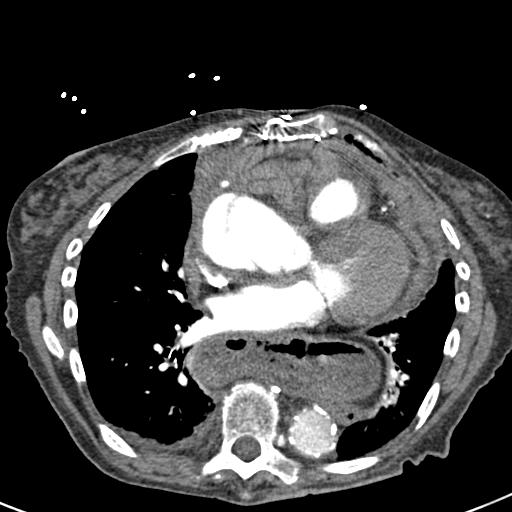
[im 77/142  soft-tissue]
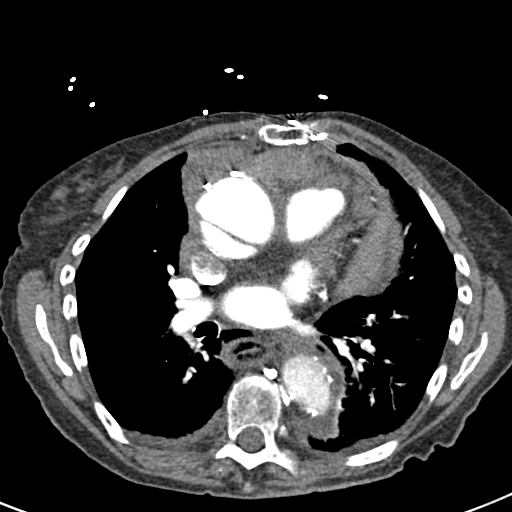
[im 90/142  soft-tissue]
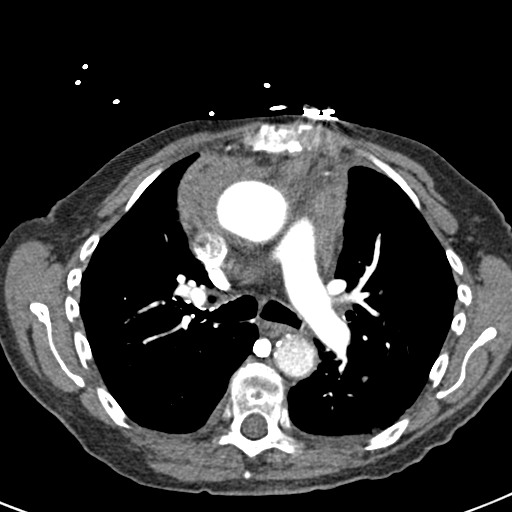
[im 103/142  soft-tissue]
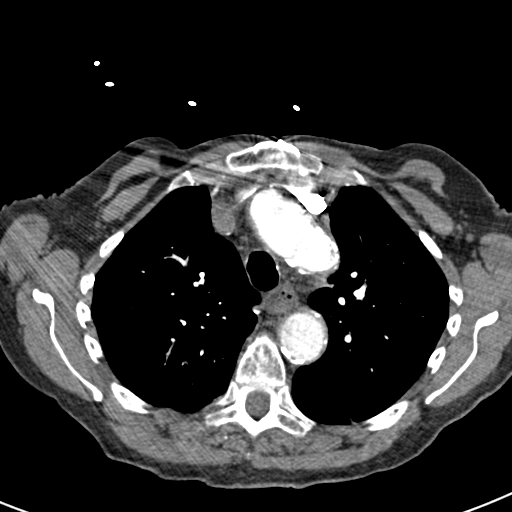
[im 116/142  soft-tissue]
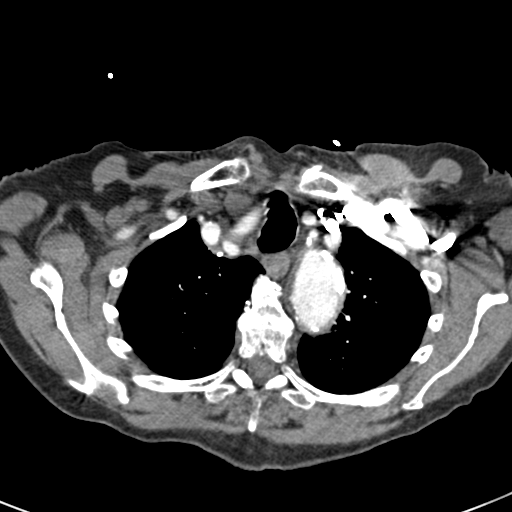
[im 116/142  lung]
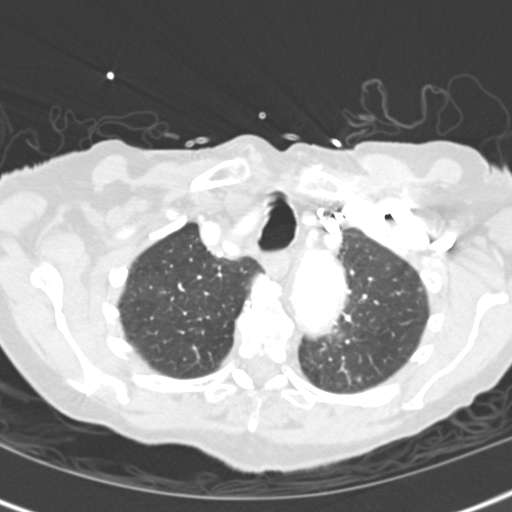
[im 116/142  bone]
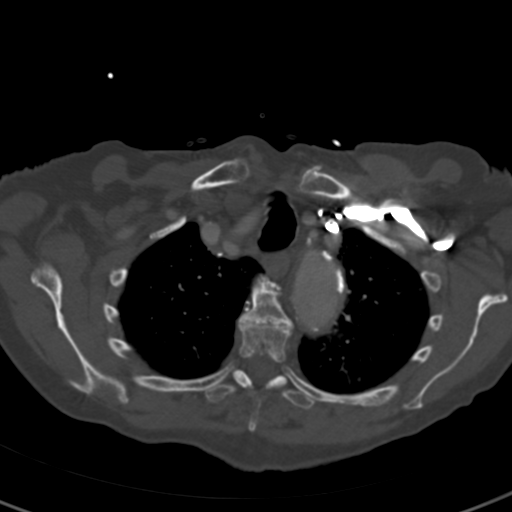
[im 122/142  lung]
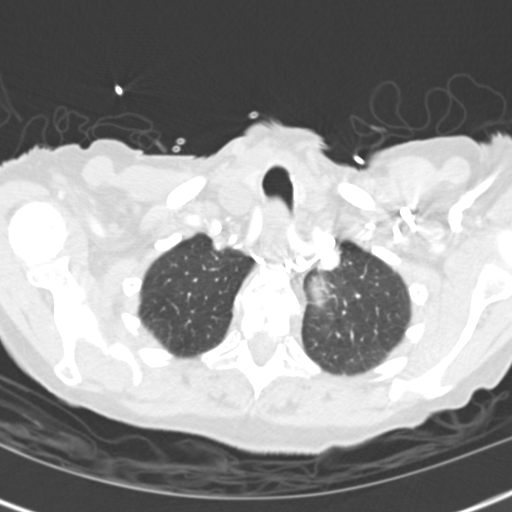
[im 129/142  soft-tissue]
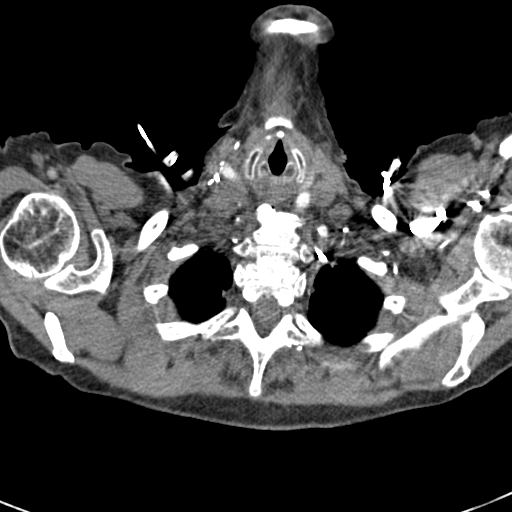
[im 129/142  lung]
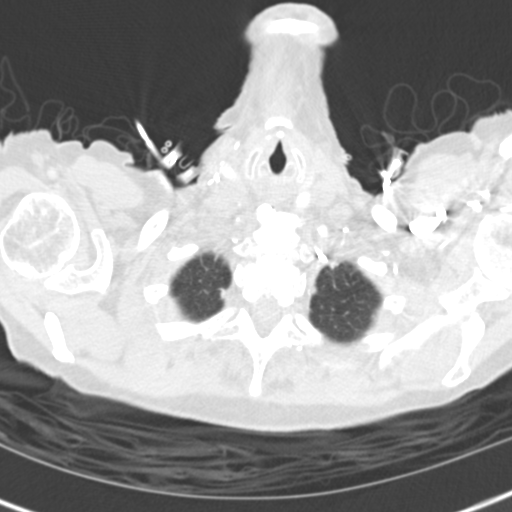
[im 135/142  lung]
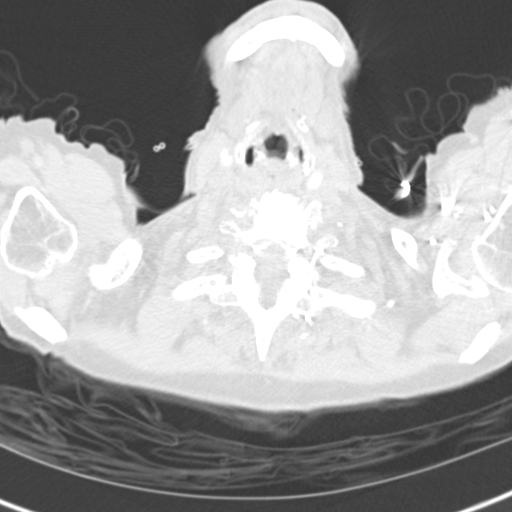

[Series 7: cor cta chest mpr · coronal · 0.54mm/px · 3 of 110 slices shown]
[im 28/110  soft-tissue]
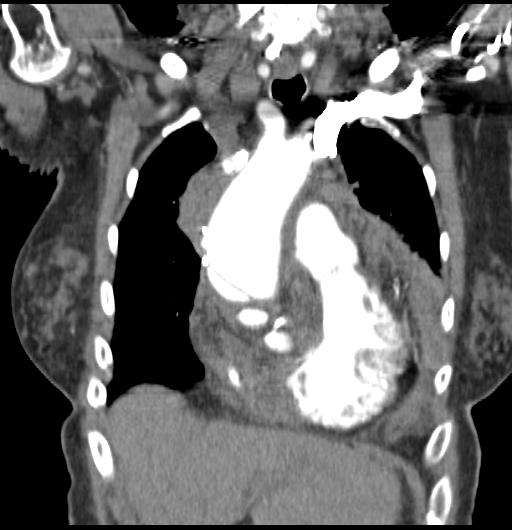
[im 55/110  soft-tissue]
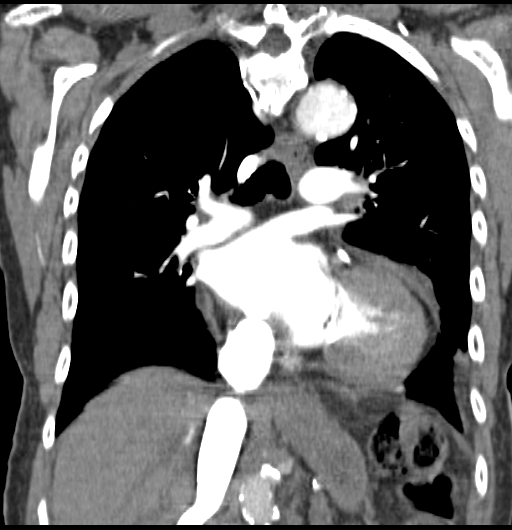
[im 82/110  soft-tissue]
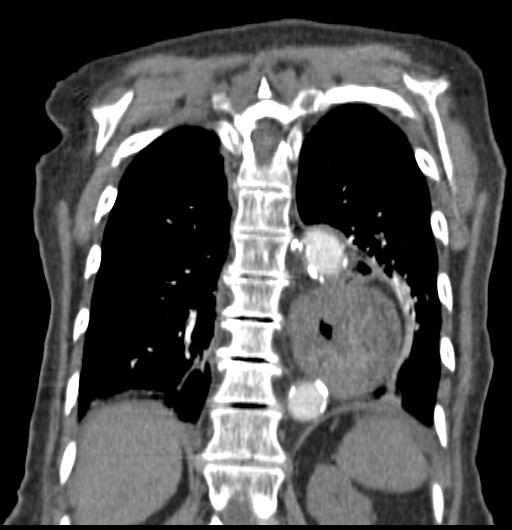

[15 of 46 positions shown; findings below may reference images not displayed]

FINDINGS: No pneumothorax or pleural effusion is noted. Large hiatal hernia is
noted. Minimal bibasilar subsegmental atelectasis is noted.

Status post coronary artery bypass graft. There appears to be a
large dissection involving the ascending thoracic aorta consistent
with type A dissection. There has been rupture of the thoracic aorta
near the aortic root with resulting hemopericardium. This appears to
be exerting tamponade effect on the heart, with significant
compression of the right atrium. Pulmonary arteries appear normal
without definite evidence of pulmonary embolus.

Great vessels appear patent without significant stenosis. The
dissection does not extend to the great vessels. Transverse aortic
arch measures 3.2 cm in diameter which is consistent with
dilatation. Atherosclerotic calcifications are seen involving the
descending thoracic aorta without aneurysm or dissection.

Review of the MIP images confirms the above findings.
IMPRESSION: Type A thoracic aortic dissection is seen which does not extend be
on the ascending thoracic aorta. There is rupture of the thoracic
aorta near the aortic root resulting in hemopericardium which
potentially may be exerting a tamponade effect on the heart.
Critical Value/emergent results were called by telephone at the time
of interpretation on 08/30/2014 at [DATE] to Dr. BALJEET MOMOH ,
who verbally acknowledged these results.
